# Patient Record
Sex: Female | Born: 1985 | Race: White | Hispanic: No | Marital: Married | Smoking: Never smoker
Health system: Southern US, Community
[De-identification: ages and names within clinical notes are randomized; demographics above are authoritative.]

---

## 2014-04-10 ENCOUNTER — Emergency Department (HOSPITAL_COMMUNITY): Payer: 59

## 2014-04-10 ENCOUNTER — Emergency Department (HOSPITAL_COMMUNITY)
Admission: EM | Admit: 2014-04-10 | Discharge: 2014-04-10 | Disposition: A | Discharge status: Home / Self Care | Payer: 59 | Attending: Emergency Medicine | Admitting: Emergency Medicine

## 2014-04-10 ENCOUNTER — Encounter (HOSPITAL_COMMUNITY): Payer: Self-pay | Admitting: Emergency Medicine

## 2014-04-10 DIAGNOSIS — R1031 Right lower quadrant pain: Secondary | ICD-10-CM | POA: Diagnosis present

## 2014-04-10 DIAGNOSIS — N83201 Unspecified ovarian cyst, right side: Secondary | ICD-10-CM

## 2014-04-10 DIAGNOSIS — Z3202 Encounter for pregnancy test, result negative: Secondary | ICD-10-CM | POA: Insufficient documentation

## 2014-04-10 DIAGNOSIS — N83209 Unspecified ovarian cyst, unspecified side: Secondary | ICD-10-CM | POA: Insufficient documentation

## 2014-04-10 LAB — COMPREHENSIVE METABOLIC PANEL
ALT: 10 U/L (ref 0–35)
AST: 13 U/L (ref 0–37)
Albumin: 3.9 g/dL (ref 3.5–5.2)
Alkaline Phosphatase: 70 U/L (ref 39–117)
Anion gap: 15 (ref 5–15)
BILIRUBIN TOTAL: 0.4 mg/dL (ref 0.3–1.2)
BUN: 6 mg/dL (ref 6–23)
CHLORIDE: 101 meq/L (ref 96–112)
CO2: 23 mEq/L (ref 19–32)
CREATININE: 0.58 mg/dL (ref 0.50–1.10)
Calcium: 9.3 mg/dL (ref 8.4–10.5)
GFR calc Af Amer: >90 mL/min (ref >90)
Glucose, Bld: 145 mg/dL — ABNORMAL HIGH (ref 70–99)
Potassium: 4 mEq/L (ref 3.7–5.3)
Sodium: 139 mEq/L (ref 137–147)
Total Protein: 7.8 g/dL (ref 6.0–8.3)

## 2014-04-10 LAB — URINALYSIS, ROUTINE W REFLEX MICROSCOPIC
BILIRUBIN URINE: NEGATIVE
Glucose, UA: NEGATIVE mg/dL
HGB URINE DIPSTICK: NEGATIVE
Ketones, ur: NEGATIVE mg/dL
Leukocytes, UA: NEGATIVE
Nitrite: NEGATIVE
PROTEIN: NEGATIVE mg/dL
Specific Gravity, Urine: 1.005 (ref 1.005–1.030)
UROBILINOGEN UA: 0.2 mg/dL (ref 0.0–1.0)
pH: 6.5 (ref 5.0–8.0)

## 2014-04-10 LAB — POC URINE PREG, ED: Preg Test, Ur: NEGATIVE

## 2014-04-10 LAB — CBC WITH DIFFERENTIAL/PLATELET
BASOS PCT: 0 % (ref 0–1)
Basophils Absolute: 0 10*3/uL (ref 0.0–0.1)
EOS PCT: 0 % (ref 0–5)
Eosinophils Absolute: 0 10*3/uL (ref 0.0–0.7)
HCT: 40.8 % (ref 36.0–46.0)
HEMOGLOBIN: 13.4 g/dL (ref 12.0–15.0)
Lymphocytes Relative: 13 % (ref 12–46)
Lymphs Abs: 1.7 10*3/uL (ref 0.7–4.0)
MCH: 27.9 pg (ref 26.0–34.0)
MCHC: 32.8 g/dL (ref 30.0–36.0)
MCV: 85 fL (ref 78.0–100.0)
MONOS PCT: 5 % (ref 3–12)
Monocytes Absolute: 0.6 10*3/uL (ref 0.1–1.0)
NEUTROS ABS: 10.2 10*3/uL — AB (ref 1.7–7.7)
Neutrophils Relative %: 82 % — ABNORMAL HIGH (ref 43–77)
Platelets: 243 10*3/uL (ref 150–400)
RBC: 4.8 MIL/uL (ref 3.87–5.11)
RDW: 13 % (ref 11.5–15.5)
WBC: 12.5 10*3/uL — ABNORMAL HIGH (ref 4.0–10.5)

## 2014-04-10 LAB — HIV ANTIBODY (ROUTINE TESTING W REFLEX): HIV 1&2 Ab, 4th Generation: NONREACTIVE

## 2014-04-10 LAB — LIPASE, BLOOD: LIPASE: 23 U/L (ref 11–59)

## 2014-04-10 LAB — RPR

## 2014-04-10 MED ORDER — SODIUM CHLORIDE 0.9 % IV SOLN
1000.0000 mL | Freq: Once | INTRAVENOUS | Status: AC
  Administered 2014-04-10: 1000 mL via INTRAVENOUS

## 2014-04-10 MED ORDER — ONDANSETRON HCL 4 MG/2ML IJ SOLN
4.0000 mg | Freq: Once | INTRAMUSCULAR | Status: DC
  Filled 2014-04-10: qty 2

## 2014-04-10 MED ORDER — SODIUM CHLORIDE 0.9 % IV SOLN
1000.0000 mL | INTRAVENOUS | Status: DC
  Administered 2014-04-10: 1000 mL via INTRAVENOUS

## 2014-04-10 MED ORDER — IOHEXOL 300 MG/ML  SOLN
25.0000 mL | Freq: Once | INTRAMUSCULAR | Status: AC | PRN
  Administered 2014-04-10: 50 mL via ORAL

## 2014-04-10 MED ORDER — OXYCODONE-ACETAMINOPHEN 5-325 MG PO TABS: 1.0000 | ORAL_TABLET | Freq: Four times a day (QID) | ORAL | Status: AC | PRN

## 2014-04-10 MED ORDER — IOHEXOL 300 MG/ML  SOLN
100.0000 mL | Freq: Once | INTRAMUSCULAR | Status: AC | PRN
  Administered 2014-04-10: 100 mL via INTRAVENOUS

## 2014-04-10 NOTE — Discharge Instructions (Signed)
Abdominal Pain, Women °Abdominal (stomach, pelvic, or belly) pain can be caused by many things. It is important to tell your doctor: °· The location of the pain. °· Does it come and go or is it present all the time? °· Are there things that start the pain (eating certain foods, exercise)? °· Are there other symptoms associated with the pain (fever, nausea, vomiting, diarrhea)? °All of this is helpful to know when trying to find the cause of the pain. °CAUSES  °· Stomach: virus or bacteria infection, or ulcer. °· Intestine: appendicitis (inflamed appendix), regional ileitis (Crohn's disease), ulcerative colitis (inflamed colon), irritable bowel syndrome, diverticulitis (inflamed diverticulum of the colon), or cancer of the stomach or intestine. °· Gallbladder disease or stones in the gallbladder. °· Kidney disease, kidney stones, or infection. °· Pancreas infection or cancer. °· Fibromyalgia (pain disorder). °· Diseases of the female organs: °¨ Uterus: fibroid (non-cancerous) tumors or infection. °¨ Fallopian tubes: infection or tubal pregnancy. °¨ Ovary: cysts or tumors. °¨ Pelvic adhesions (scar tissue). °¨ Endometriosis (uterus lining tissue growing in the pelvis and on the pelvic organs). °¨ Pelvic congestion syndrome (female organs filling up with blood just before the menstrual period). °¨ Pain with the menstrual period. °¨ Pain with ovulation (producing an egg). °¨ Pain with an IUD (intrauterine device, birth control) in the uterus. °¨ Cancer of the female organs. °· Functional pain (pain not caused by a disease, may improve without treatment). °· Psychological pain. °· Depression. °DIAGNOSIS  °Your doctor will decide the seriousness of your pain by doing an examination. °· Blood tests. °· X-rays. °· Ultrasound. °· CT scan (computed tomography, special type of X-ray). °· MRI (magnetic resonance imaging). °· Cultures, for infection. °· Barium enema (dye inserted in the large intestine, to better view it with  X-rays). °· Colonoscopy (looking in intestine with a lighted tube). °· Laparoscopy (minor surgery, looking in abdomen with a lighted tube). °· Major abdominal exploratory surgery (looking in abdomen with a large incision). °TREATMENT  °The treatment will depend on the cause of the pain.  °· Many cases can be observed and treated at home. °· Over-the-counter medicines recommended by your caregiver. °· Prescription medicine. °· Antibiotics, for infection. °· Birth control pills, for painful periods or for ovulation pain. °· Hormone treatment, for endometriosis. °· Nerve blocking injections. °· Physical therapy. °· Antidepressants. °· Counseling with a psychologist or psychiatrist. °· Minor or major surgery. °HOME CARE INSTRUCTIONS  °· Do not take laxatives, unless directed by your caregiver. °· Take over-the-counter pain medicine only if ordered by your caregiver. Do not take aspirin because it can cause an upset stomach or bleeding. °· Try a clear liquid diet (broth or water) as ordered by your caregiver. Slowly move to a bland diet, as tolerated, if the pain is related to the stomach or intestine. °· Have a thermometer and take your temperature several times a day, and record it. °· Bed rest and sleep, if it helps the pain. °· Avoid sexual intercourse, if it causes pain. °· Avoid stressful situations. °· Keep your follow-up appointments and tests, as your caregiver orders. °· If the pain does not go away with medicine or surgery, you may try: °¨ Acupuncture. °¨ Relaxation exercises (yoga, meditation). °¨ Group therapy. °¨ Counseling. °SEEK MEDICAL CARE IF:  °· You notice certain foods cause stomach pain. °· Your home care treatment is not helping your pain. °· You need stronger pain medicine. °· You want your IUD removed. °· You feel faint or   lightheaded. °· You develop nausea and vomiting. °· You develop a rash. °· You are having side effects or an allergy to your medicine. °SEEK IMMEDIATE MEDICAL CARE IF:  °· Your  pain does not go away or gets worse. °· You have a fever. °· Your pain is felt only in portions of the abdomen. The right side could possibly be appendicitis. The left lower portion of the abdomen could be colitis or diverticulitis. °· You are passing blood in your stools (bright red or black tarry stools, with or without vomiting). °· You have blood in your urine. °· You develop chills, with or without a fever. °· You pass out. °MAKE SURE YOU:  °· Understand these instructions. °· Will watch your condition. °· Will get help right away if you are not doing well or get worse. °Document Released: 05/06/2007 Document Revised: 11/23/2013 Document Reviewed: 05/26/2009 °[employer]® Patient Information ©2015 [employer]. This information is not intended to replace advice given to you by your health care provider. Make sure you discuss any questions you have with your health care provider. ° °

## 2014-04-10 NOTE — ED Provider Notes (Signed)
Assumed care from Dr. Lynelle Doctor. CT neg. I discussed presentation w/ pt. Will get [place] to r/o torsion although lower suspicion for this but feel it may be pertinent to r/o given sudden onset of sx.    Pt found to have right ovarian cyst, rec f/u US in 6-12 weeks, pt understands. Pain controlled. Well in appearance.  I have discussed the diagnosis/risks/treatment options with the patient and family and believe the pt to be eligible for discharge home to follow-up with her pcp. We also discussed returning to the ED immediately if new or worsening sx occur. We discussed the sx which are most concerning (e.g., worsening pain, fever, vomiting) that necessitate immediate return. Medications administered to the patient during their visit and any new prescriptions provided to the patient are listed below.  Medications given during this visit Medications  0.9 %  sodium chloride infusion (0 mLs Intravenous Stopped 04/10/14 1443)    Followed by  0.9 %  sodium chloride infusion (1,000 mLs Intravenous New Bag/Given 04/10/14 1444)  ondansetron (ZOFRAN) injection 4 mg (0 mg Intravenous Hold 04/10/14 1319)  iohexol (OMNIPAQUE) 300 MG/ML solution 25 mL (50 mLs Oral Contrast Given 04/10/14 1607)  iohexol (OMNIPAQUE) 300 MG/ML solution 100 mL (100 mLs Intravenous Contrast Given 04/10/14 1607)    Discharge Medication List as of 04/10/2014  7:50 PM    START taking these medications   Details  oxyCODONE-acetaminophen (PERCOCET) 5-325 MG per tablet Take 1-2 tablets by mouth every 6 (six) hours as needed., Starting 04/10/2014, Until Discontinued, Print       Clinical Impression 1. Right ovarian cyst   2. Right lower quadrant abdominal pain      Purvis Sheffield, MD 04/11/14 1102

## 2014-04-10 NOTE — ED Notes (Signed)
Pt reports abd pain since yesterday, progressively worse. Reports bloating today. Denies n/v, urinary symptoms. Rebound tenderness lower mid and lower right quadrant. Low grade fever.

## 2014-04-10 NOTE — ED Notes (Signed)
Bed: ZO10 Expected date:  Expected time:  Means of arrival:  Comments: R/o appy Lorretta Harp)

## 2014-04-10 NOTE — ED Notes (Signed)
She c/o pain across low abd. Yesterday which has migrated to rlq area and persists.  She also states she started her period today.  She states "This feels very different and worse than my menstrual cramps and discomfort feels"  She is in no distress.  She denies dysuria/n/v/d and states her appetite is intact; and that she ate and drank about an hour p.t.a.

## 2014-04-10 NOTE — ED Provider Notes (Signed)
CSN: [id number]     Arrival date & time 04/10/14  1202 History   First MD Initiated Contact with Patient 04/10/14 1238     Chief Complaint  Patient presents with  . Abdominal Pain   HPI Abdominal pain started yesterday.  Temp measured at 99.4.  No nausea.  Good appetite.  No urinary symptoms.  Feels bloated.  Pain moving to the right side.  Talked to some friends.  Pt went to a walk in clinic this morning.  Pt had a urine sample, vitals and an exam.  Told to come to the ED.  History reviewed. No pertinent past medical history. History reviewed. No pertinent past surgical history. No family history on file. History  Substance Use Topics  . Smoking status: Never Smoker   . Smokeless tobacco: Not on file  . Alcohol Use: Yes     Comment: rarely   OB History   Grav Para Term Preterm Abortions TAB SAB Ect Mult Living                 Review of Systems  All other systems reviewed and are negative.     Allergies  Review of patient's allergies indicates no known allergies.  Home Medications   Prior to Admission medications   Not on File   BP 148/83  Pulse 102  Temp(Src) 98.7 F (37.1 C) (Oral)  Resp 14  SpO2 100%  LMP 04/10/2014 Physical Exam  Nursing note and vitals reviewed. Constitutional: She appears well-developed and well-nourished. No distress.  HENT:  Head: Normocephalic and atraumatic.  Right Ear: External ear normal.  Left Ear: External ear normal.  Eyes: Conjunctivae are normal. Right eye exhibits no discharge. Left eye exhibits no discharge. No scleral icterus.  Neck: Neck supple. No tracheal deviation present.  Cardiovascular: Normal rate, regular rhythm and intact distal pulses.   Pulmonary/Chest: Effort normal and breath sounds normal. No stridor. No respiratory distress. She has no wheezes. She has no rales.  Abdominal: Soft. Bowel sounds are normal. She exhibits no distension. There is tenderness in the right lower quadrant, suprapubic area and left lower  quadrant. There is guarding. There is no rigidity and no rebound. No hernia.  Genitourinary: Uterus is tender. Cervix exhibits no motion tenderness. Right adnexum displays no mass, no tenderness and no fullness. Left adnexum displays no mass, no tenderness and no fullness. There is bleeding around the vagina. No signs of injury around the vagina.  Blood in the vaginal vault  Musculoskeletal: She exhibits no edema and no tenderness.  Neurological: She is alert. She has normal strength. No cranial nerve deficit (no facial droop, extraocular movements intact, no slurred speech) or sensory deficit. She exhibits normal muscle tone. She displays no seizure activity. Coordination normal.  Skin: Skin is warm and dry. No rash noted.  Psychiatric: She has a normal mood and affect.    ED Course  Procedures (including critical care time) Labs Review Labs Reviewed  CBC WITH DIFFERENTIAL - Abnormal; Notable for the following:    WBC 12.5 (*)    Neutrophils Relative % 82 (*)    Neutro Abs 10.2 (*)    All other components within normal limits  COMPREHENSIVE METABOLIC PANEL - Abnormal; Notable for the following:    Glucose, Bld 145 (*)    All other components within normal limits  GC/CHLAMYDIA PROBE AMP  LIPASE, BLOOD  URINALYSIS, ROUTINE W REFLEX MICROSCOPIC  RPR  HIV ANTIBODY (ROUTINE TESTING)  POC URINE PREG, ED  POC URINE  PREG, ED    Imaging Review No results found.   MDM    Pt's symptoms may be related to her menses however she mentions possible low grade temps at home and pain more located on the right side.  Will ct to rule out appendicitis.  Dr Romeo Apple will follow up on results     Linwood Dibbles, MD 04/10/14 [phone]

## 2014-04-12 LAB — GC/CHLAMYDIA PROBE AMP
CT Probe RNA: NEGATIVE
GC Probe RNA: NEGATIVE

## 2015-09-27 IMAGING — CT CT ABD-PELV W/ CM
1 of 2 series · 15 of 32 positions shown, 19 images · IV contrast (OMNIPAQUE 300)
Comparison: None

CLINICAL DATA: Began with pain across the lower abdomen yesterday
migrating to the right lower quadrant.

EXAM:
CT ABDOMEN AND PELVIS WITH CONTRAST
TECHNIQUE: Multidetector CT imaging of the abdomen and pelvis was performed
using the standard protocol following bolus administration of
intravenous contrast.
CONTRAST:  100mL OMNIPAQUE IOHEXOL 300 MG/ML SOLN, 50mL OMNIPAQUE
IOHEXOL 300 MG/ML SOLN

[Series 2: abd/pel with · axial · 0.74mm/px · z∈[+963,+1363]mm · 15 of 88 slices shown, 19 images]
[im 4/88  soft-tissue]
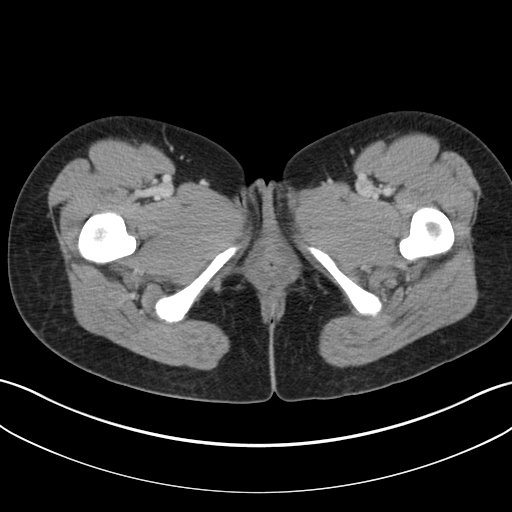
[im 4/88  bone]
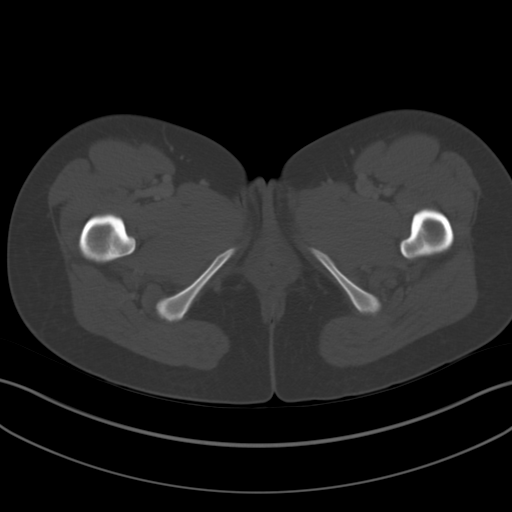
[im 11/88  soft-tissue]
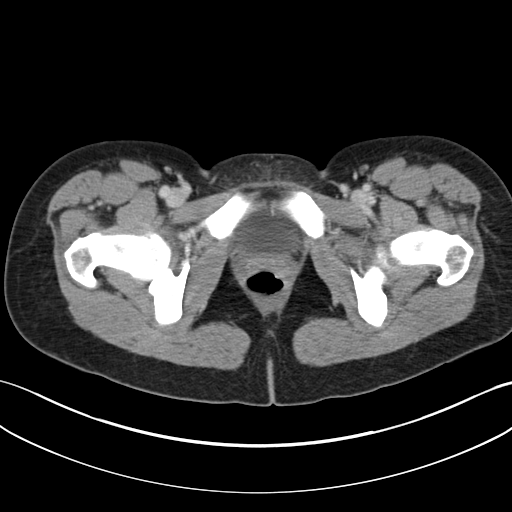
[im 17/88  soft-tissue]
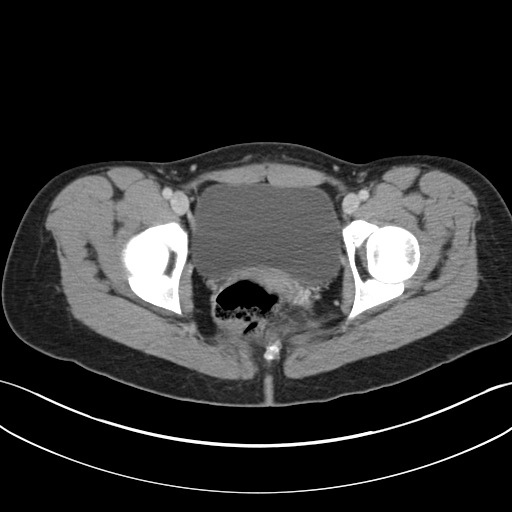
[im 24/88  soft-tissue]
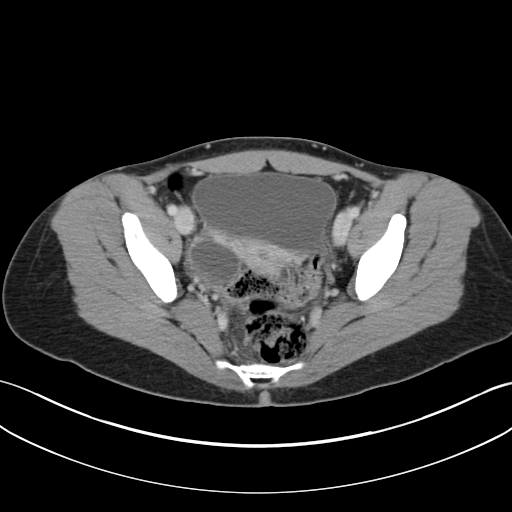
[im 31/88  soft-tissue]
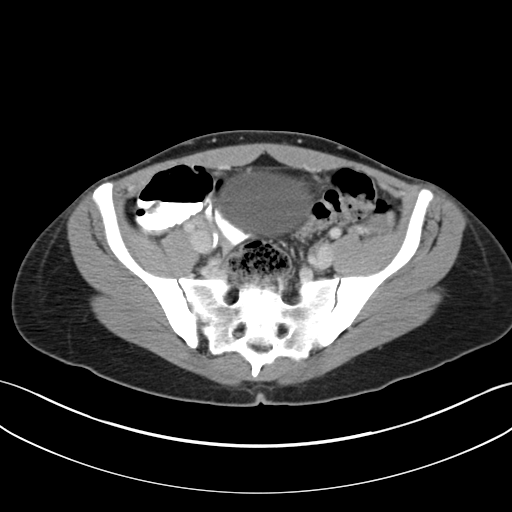
[im 37/88  soft-tissue]
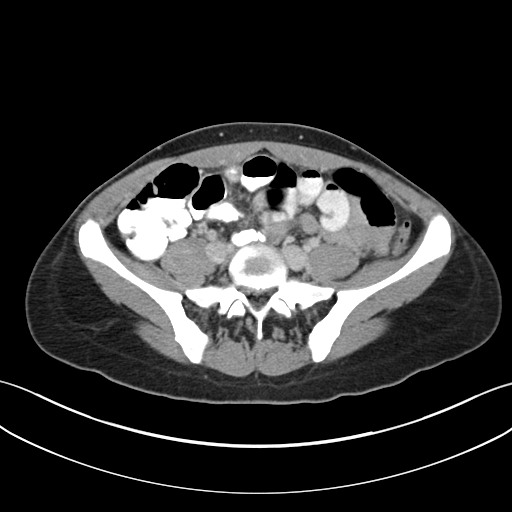
[im 44/88  soft-tissue]
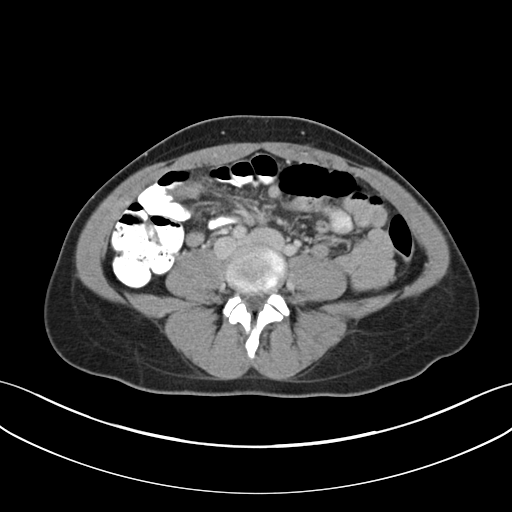
[im 51/88  soft-tissue]
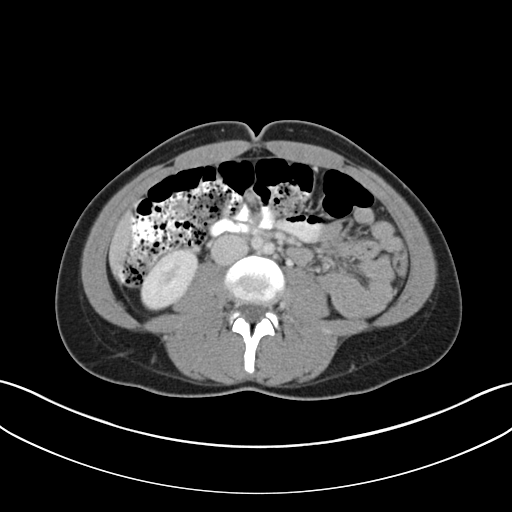
[im 57/88  soft-tissue]
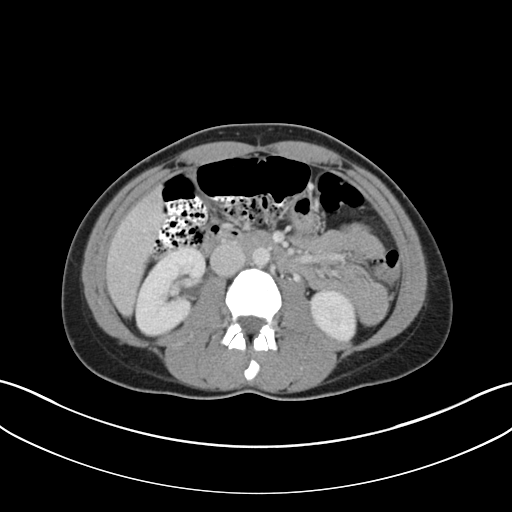
[im 57/88  bone]
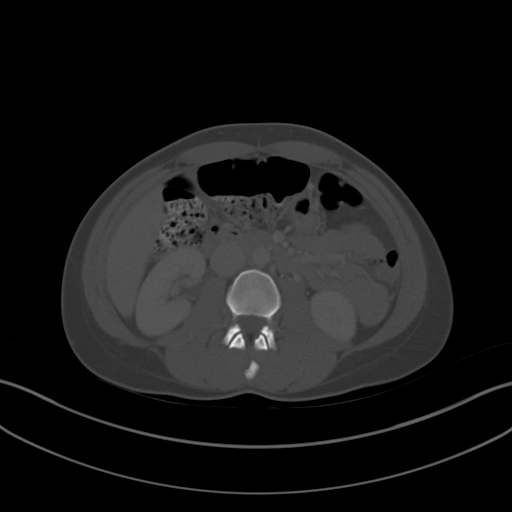
[im 64/88  soft-tissue]
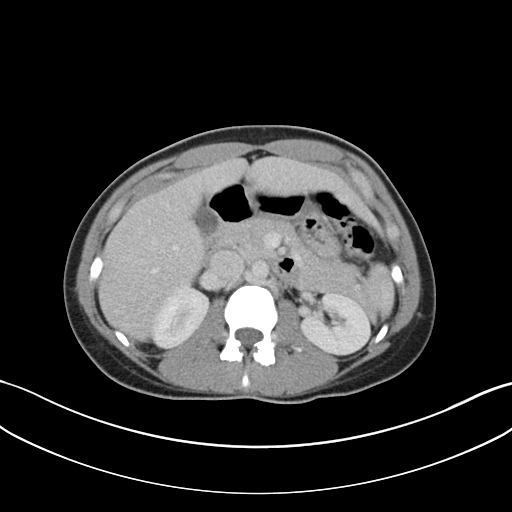
[im 71/88  soft-tissue]
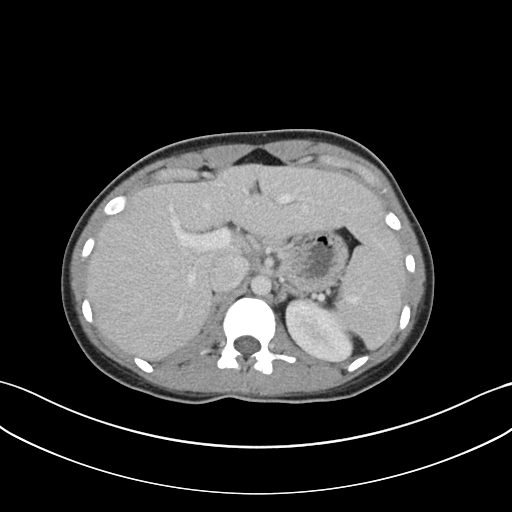
[im 74/88  lung]
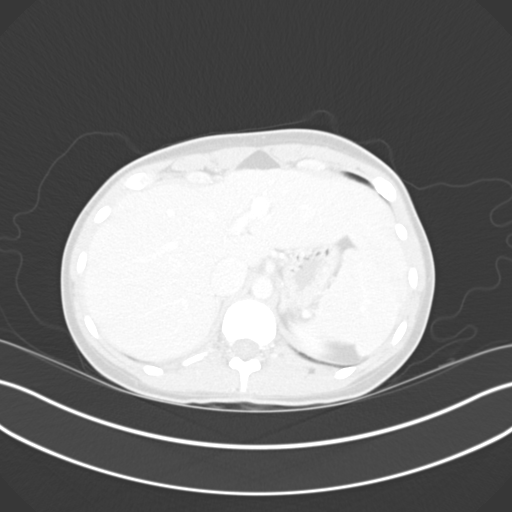
[im 77/88  soft-tissue]
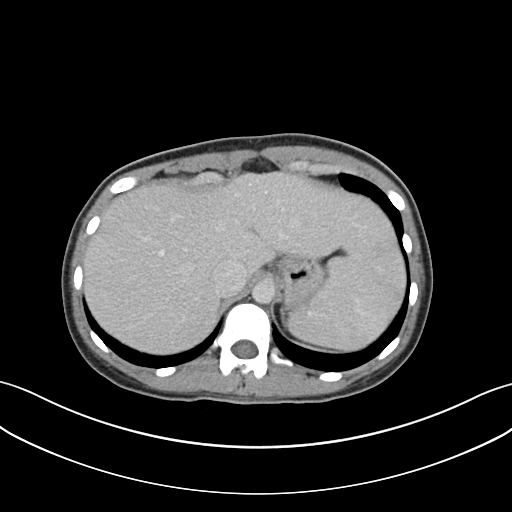
[im 77/88  lung]
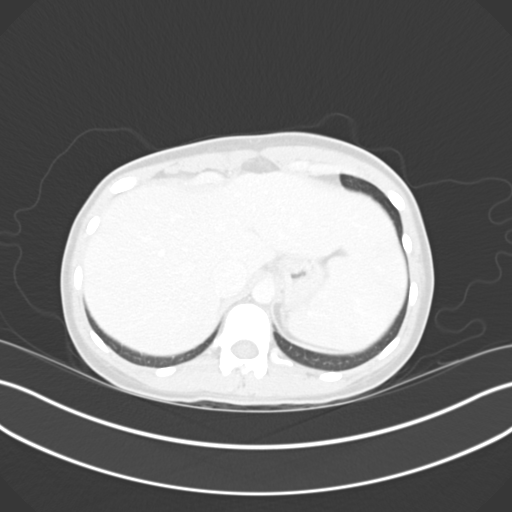
[im 81/88  lung]
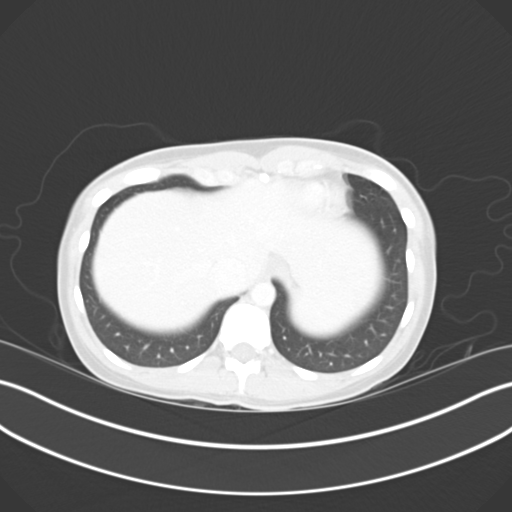
[im 84/88  soft-tissue]
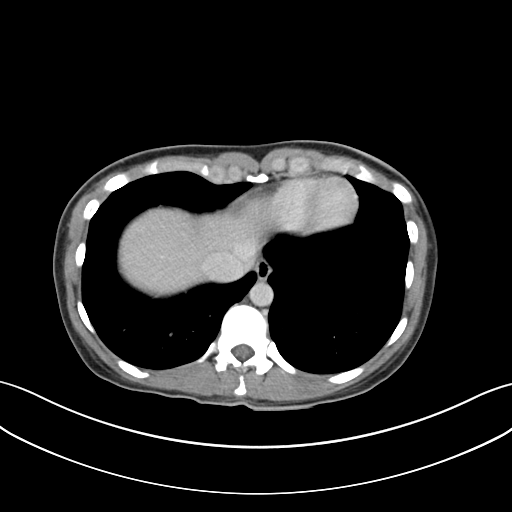
[im 84/88  lung]
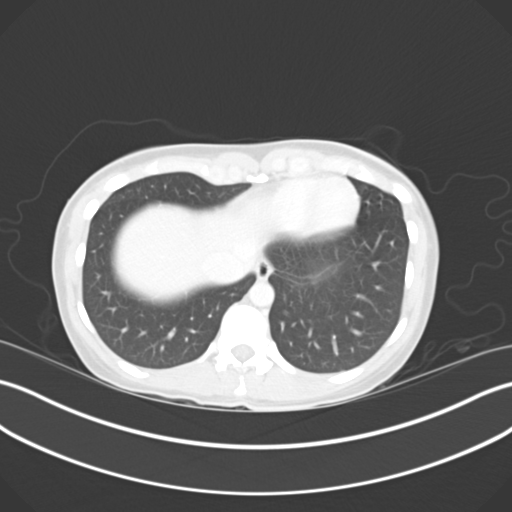

[15 of 32 positions shown; findings below may reference images not displayed]

FINDINGS: Contrast fills a normal-sized appendix which extends posteriorly
into the right pelvis adjacent to the right ovary.

Right ovary is prominent measuring 3.9 cm in greatest dimension.
There is a dominant ovarian cyst. Normal uterus. Unremarkable left
adnexa.

Clear lung bases.  Heart is normal size.

Normal liver, spleen, gallbladder, pancreas, adrenal glands,
kidneys, ureters, bladder.

No adenopathy.  No abnormal fluid collections.

Mild to moderate increased stool burden throughout the colon. No
colonic wall thickening or inflammatory change. Normal small bowel.

No bony abnormality.
IMPRESSION: 1. No acute findings.  Normal appendix visualized.
2. Right ovary is prominent, containing a dominant cyst. This will
not need further evaluation unless there are clinical findings
concerning for ovarian torsion, which would indicate the need for
pelvic ultrasound with Doppler imaging.
3. Mild to moderate increased stool burden throughout the colon.
4. No other abnormalities.

## 2015-09-27 IMAGING — US US ART/VEN ABD/PELV/SCROTUM DOPPLER LTD
1 series · 13 of 25 positions shown · non-contrast
Comparison: Current abdomen and pelvis CT

CLINICAL DATA: Right lower quadrant pain. Right ovarian cyst on
current CT.

EXAM:
TRANSABDOMINAL AND TRANSVAGINAL ULTRASOUND OF PELVIS
DOPPLER ULTRASOUND OF OVARIES
TECHNIQUE: Both transabdominal and transvaginal ultrasound examinations of the
pelvis were performed. Transabdominal technique was performed for
global imaging of the pelvis including uterus, ovaries, adnexal
regions, and pelvic cul-de-sac.
It was necessary to proceed with endovaginal exam following the
transabdominal exam to visualize the ovaries and adnexa to better
advantage. Color and duplex Doppler ultrasound was utilized to
evaluate blood flow to the ovaries.

[Series 1: us art/ven abd/pelv/scrotum doppler ltd · 0.21mm/px · 70 acquisitions, 13 frames shown]
[im 1/70]
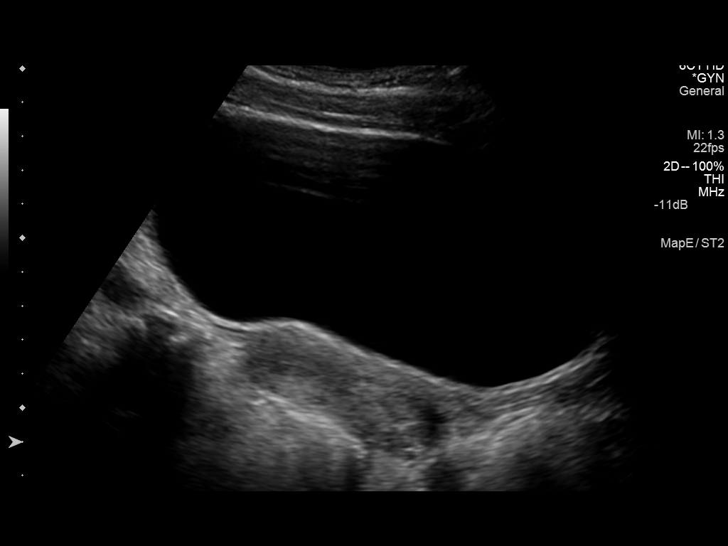
[im 6/70]
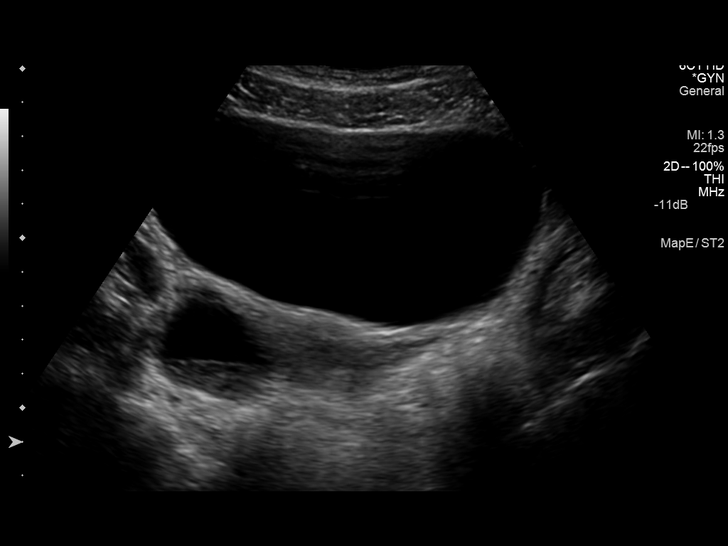
[im 12/70]
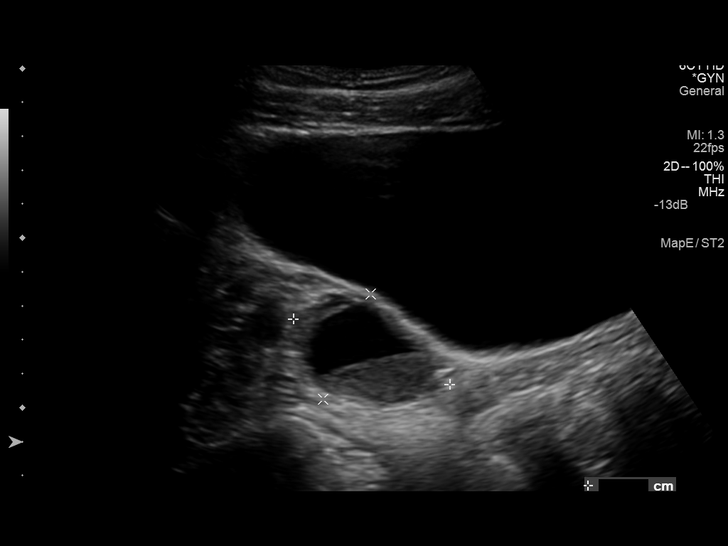
[im 18/70]
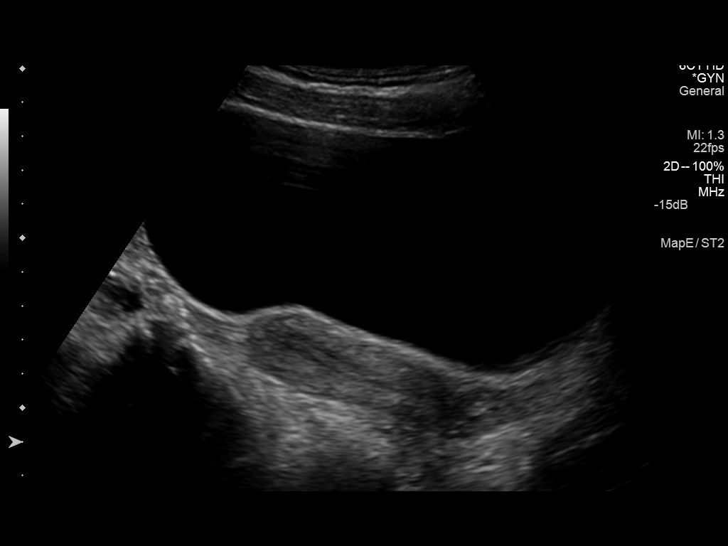
[im 24/70]
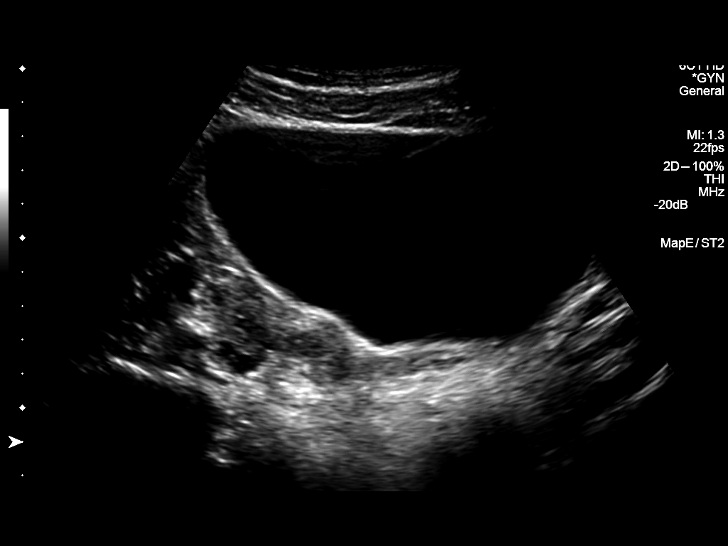
[im 29/70]
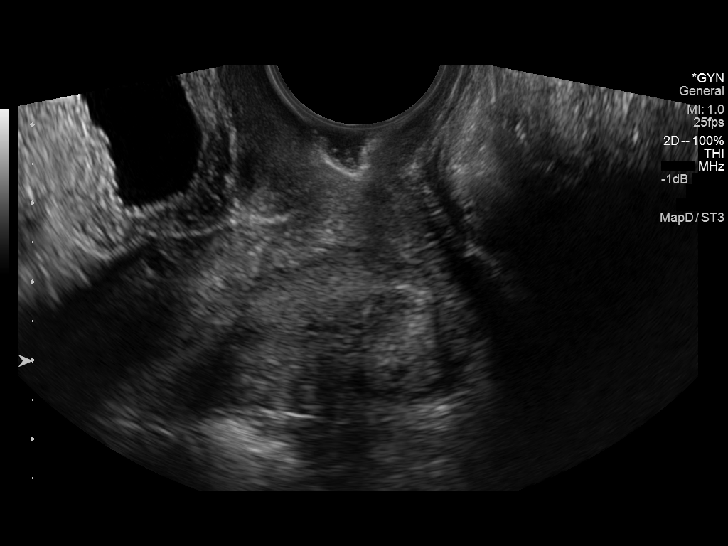
[im 35/70]
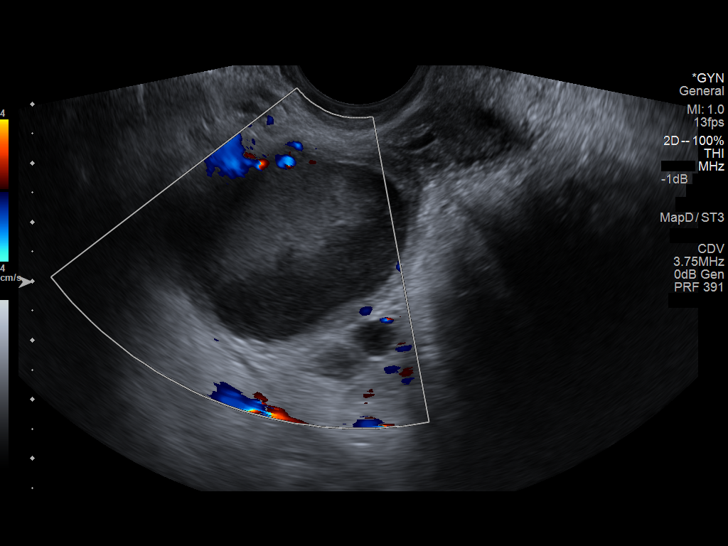
[im 41/70]
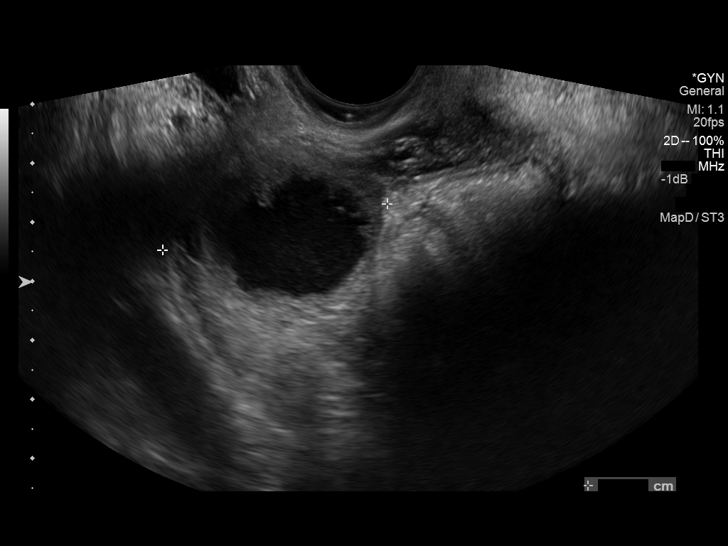
[im 47/70]
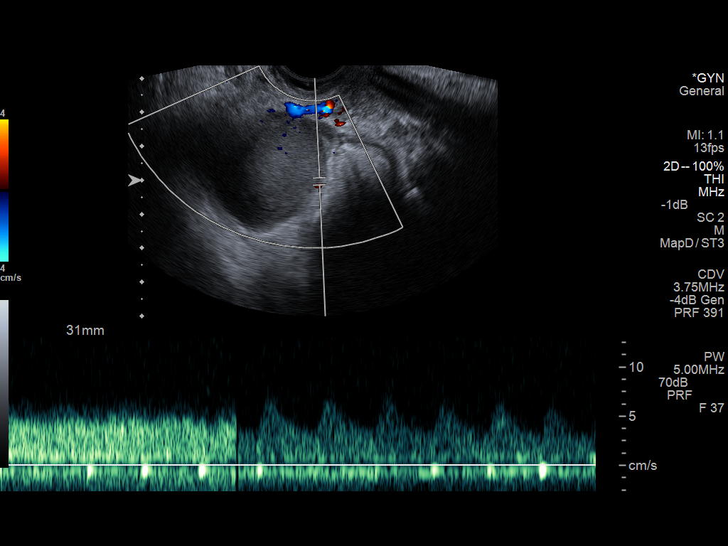
[im 52/70]
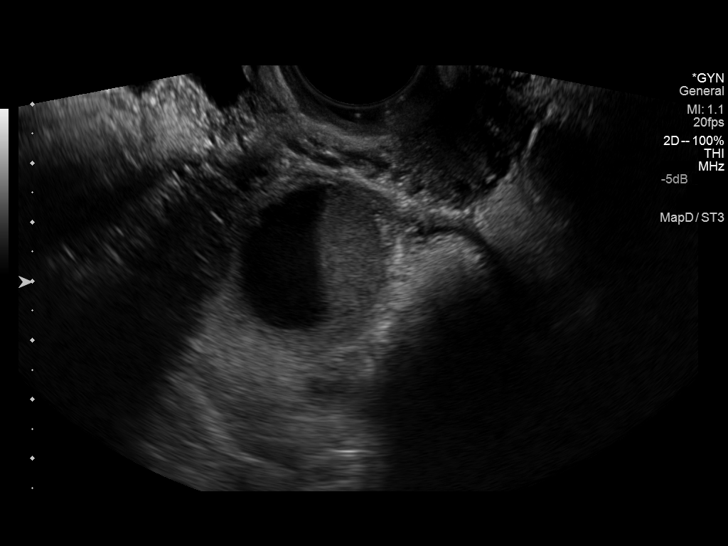
[im 58/70]
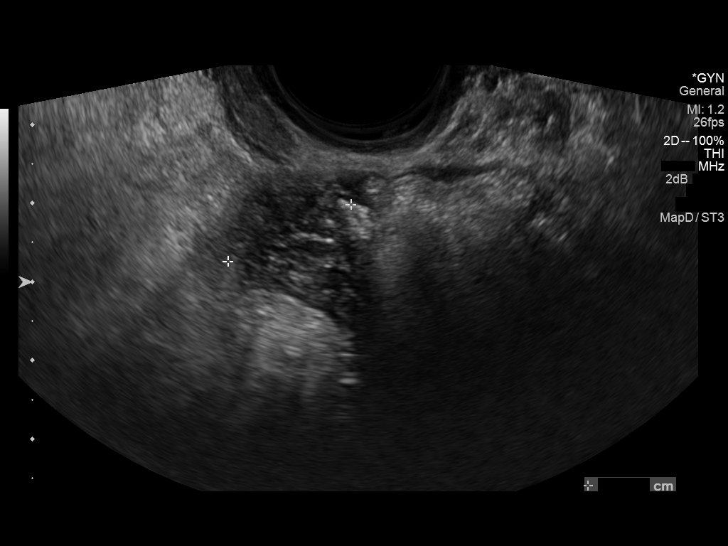
[im 64/70]
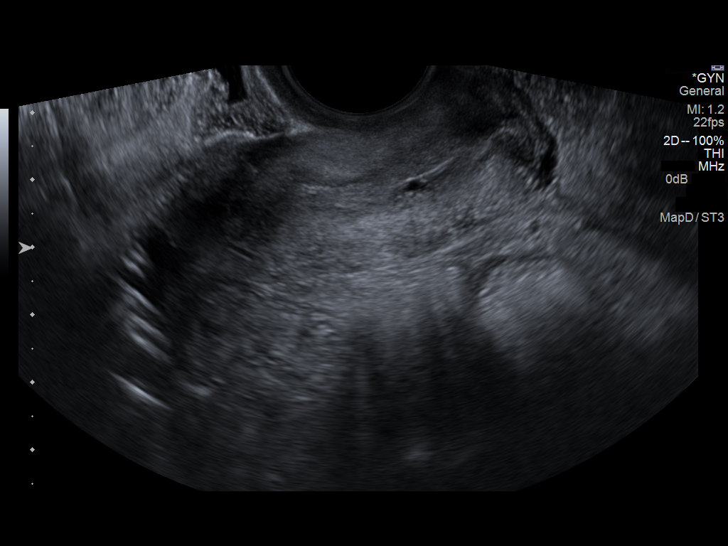
[im 70/70]
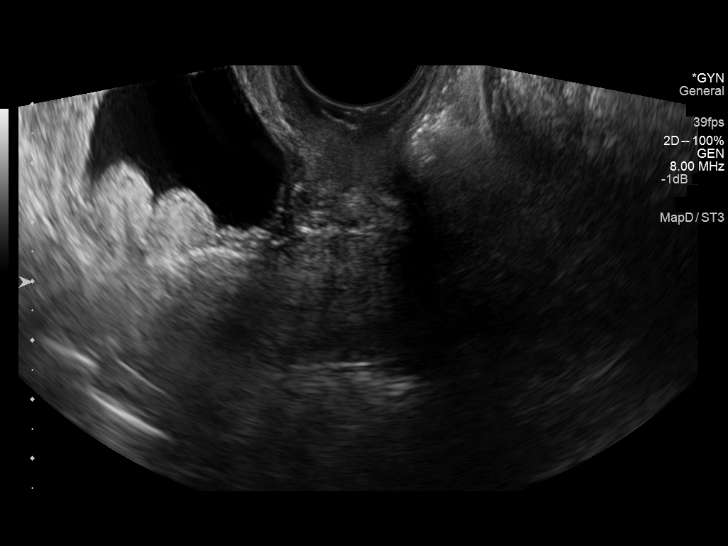

[13 of 25 positions shown; findings below may reference images not displayed]

FINDINGS: Uterus

Measurements: 6.6 cm x 2.4 cm x 3.3 cm. No fibroids or other mass
visualized.

Endometrium

Thickness: 5.2 mm.  No focal abnormality visualized.

Right ovary

Measurements: 4.4 cm x 4 cm x 4.4 cm. Complex cyst measuring 3.4 cm
x 3.5 cm x 4 cm. Cysts has dependent debris and slight irregularity
along the wall. This is likely a hemorrhagic cyst. Color Doppler
blood flow seen along the ovarian tissue draped over this cyst.

Left ovary

Measurements: 2.4 cm x 1.5 cm x 2.0 cm. Normal appearance/no adnexal
mass.

Pulsed Doppler evaluation of both ovaries demonstrates normal
low-resistance arterial and venous waveforms.

Other findings

Trace pelvic free fluid, likely physiologic.
IMPRESSION: 1. Complex right ovarian cyst measuring 4 cm in greatest dimension.
This is likely a hemorrhagic cyst. Short-interval follow up
ultrasound in 6-12 weeks is recommended, preferably during the week
following the patient's normal menses.
2. No ovarian torsion. No adnexal masses. Normal left ovary. Normal
uterus.
